# Patient Record
Sex: Male | Born: 1990 | Race: Black or African American | Hispanic: No | Marital: Single | Smoking: Current every day smoker
Health system: Southern US, Community
[De-identification: ages and names within clinical notes are randomized; demographics above are authoritative.]

---
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
## 2015-11-29 ENCOUNTER — Encounter (HOSPITAL_BASED_OUTPATIENT_CLINIC_OR_DEPARTMENT_OTHER): Payer: Self-pay | Admitting: *Deleted

## 2015-11-29 ENCOUNTER — Emergency Department (HOSPITAL_BASED_OUTPATIENT_CLINIC_OR_DEPARTMENT_OTHER): Payer: BLUE CROSS/BLUE SHIELD

## 2015-11-29 ENCOUNTER — Emergency Department (HOSPITAL_BASED_OUTPATIENT_CLINIC_OR_DEPARTMENT_OTHER)
Admission: EM | Admit: 2015-11-29 | Discharge: 2015-11-29 | Disposition: A | Discharge status: Home / Self Care | Payer: BLUE CROSS/BLUE SHIELD | Attending: Emergency Medicine | Admitting: Emergency Medicine

## 2015-11-29 DIAGNOSIS — S161XXA Strain of muscle, fascia and tendon at neck level, initial encounter: Secondary | ICD-10-CM

## 2015-11-29 DIAGNOSIS — F17210 Nicotine dependence, cigarettes, uncomplicated: Secondary | ICD-10-CM | POA: Insufficient documentation

## 2015-11-29 DIAGNOSIS — S39012A Strain of muscle, fascia and tendon of lower back, initial encounter: Secondary | ICD-10-CM | POA: Insufficient documentation

## 2015-11-29 DIAGNOSIS — Y9389 Activity, other specified: Secondary | ICD-10-CM | POA: Diagnosis not present

## 2015-11-29 DIAGNOSIS — Y999 Unspecified external cause status: Secondary | ICD-10-CM | POA: Diagnosis not present

## 2015-11-29 DIAGNOSIS — Y92410 Unspecified street and highway as the place of occurrence of the external cause: Secondary | ICD-10-CM | POA: Diagnosis not present

## 2015-11-29 DIAGNOSIS — S3992XA Unspecified injury of lower back, initial encounter: Secondary | ICD-10-CM | POA: Diagnosis present

## 2015-11-29 MED ORDER — CYCLOBENZAPRINE HCL 10 MG PO TABS: 10.0000 mg | ORAL_TABLET | Freq: Two times a day (BID) | ORAL | 0 refills | Status: AC | PRN

## 2015-11-29 MED FILL — CYCLOBENZAPRINE 10 MG TAB: 10 | 7 days supply | Qty: 15 | Fill #0

## 2015-11-29 NOTE — ED Notes (Signed)
Pt directed to pharmacy to pick up RX 

## 2015-11-29 NOTE — Discharge Instructions (Signed)
Ibuprofen 600 mg every 6 hours as needed for pain. ° °Flexeril as prescribed as needed for pain not relieved with ibuprofen. ° °Follow-up with your primary Dr. if not improving in the next week. °
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
## 2015-11-29 NOTE — ED Notes (Signed)
PA student at bedside.

## 2015-11-29 NOTE — ED Notes (Signed)
MD at bedside. 

## 2015-11-29 NOTE — ED Notes (Signed)
Patient transported to X-ray 
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
